# Patient Record
Sex: Female | Born: 2005 | Race: White | Hispanic: No | Marital: Single | Smoking: Never smoker
Health system: Southern US, Community
[De-identification: ages and names within clinical notes are randomized; demographics above are authoritative.]

## PROBLEM LIST (undated history)

## (undated) DIAGNOSIS — L709 Acne, unspecified: Secondary | ICD-10-CM

## (undated) DIAGNOSIS — L309 Dermatitis, unspecified: Secondary | ICD-10-CM

## (undated) HISTORY — DX: Acne, unspecified: L70.9

## (undated) HISTORY — DX: Dermatitis, unspecified: L30.9

---
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
## 2024-01-07 ENCOUNTER — Ambulatory Visit: Payer: Self-pay | Admitting: Family Medicine

## 2024-01-07 NOTE — Telephone Encounter (Signed)
 Chief Complaint: Wrong medication given Symptoms: Lethargic, dizzy Frequency: N/A, symptoms gone Pertinent Negatives: Patient denies any symptoms currently Disposition: [] ED /[] Urgent Care (no appt availability in office) / [] Appointment(In office/virtual)/ []  [facility]/ [] Home Care/ [] Refused Recommended Disposition /[] [facility]/ [x]  Follow-up with PCP Additional Notes: Spoke with pt's mom, Victorino Dike. Pt was prescribed glycopyrrolate for excessive sweating. Pt mom realized last night the pill looked different than normal and she realized it said watson 461 (glipizide). Pt was prescribed the wrong medication at [facility]. The glycopyrrolate sometimes causes lethargy and dizziness, which pt experienced with watson 461. Pt noticed the side effects were worse than normal so pt stopped taking medication on Thursday. Pt has not experienced any side effects since stopping the medication. Pt mom wanted to make provider aware of what happened. Pt mom states she is [facility] and [facility] right [facility]. This RN educated pt mom on when to call back/seek emergent care. Pt mo verbalized understanding and agrees to plan.   Copied from CRM [id number]. Topic: Clinical - Red Word Triage >> Jan 07, 2024 10:46 AM Theodis Sato wrote: Red Word that prompted transfer to Nurse Triage: Patients mother calling in as the patients pharmacy gave the patient the wrong medication - a type 2 diabetes medication. Patient was prescribed glycopyrrolate (ROBINUL) 2 MG tablet - Patients mother states her daughter has been feeling "weird" and has taken 4 of the incorrect pills. Reason for Disposition  Caller has medicine question only, adult not sick, AND triager answers question  Answer Assessment - Initial Assessment Questions 1. NAME of MEDICINE: "What medicine(s) are you calling about?"     Glycopyrrolate 2 mg daily; pt mom noticed the pill said watson 461 (glipizide) 2. PRESCRIBER:  "Who prescribed the medicine?" Reason: if prescribed by specialist, call should be referred to that group.     Excell Seltzer, MD 3. SYMPTOMS: "Do you have any symptoms?" If Yes, ask: "What symptoms are you having?"  "How bad are the symptoms (e.g., mild, moderate, severe)     No symptoms currently- was having lethargy and dizziness  Protocols used: Medication Question Call-A-AH

## 2024-01-08 NOTE — Telephone Encounter (Signed)
 Noted, I checked her records and the correct prescription was ordered from our end.

## 2024-01-23 ENCOUNTER — Telehealth: Payer: Self-pay | Admitting: Family Medicine

## 2024-01-23 NOTE — Telephone Encounter (Signed)
 Lvmtcb, sent mychart message

## 2024-01-23 NOTE — Telephone Encounter (Signed)
 Please call this patient and ask her to make an appointment around April 20 for a lab visit followed by an in person office visit with me. Her mom has requested this but does not want the patient to know that she is involved.

## 2024-01-25 NOTE — Telephone Encounter (Signed)
 Maria Figueroa notified as instructed by telephone. Fasting lab appointment scheduled for 03/03/2024 at 10:30 am and Dr. Ermalene Searing 03/07/2024 at 10:40 am.

## 2024-01-25 NOTE — Telephone Encounter (Signed)
 Copied from CRM [id number]. Topic: Clinical - Request for Lab/Test Order >> Jan 25, 2024 10:48 AM Sim Boast F wrote: Reason for CRM: Patient called and would like to know why she needs to come in for bloodwork since she had bloodwork in January and a follow up scheduled for July?

## 2024-01-25 NOTE — Telephone Encounter (Signed)
 Let the patient know I would like to do a 5-month lab check so sometime around April 20 given her blood sugar A1c test had increased intervally over the last 2 tests.

## 2024-02-18 ENCOUNTER — Telehealth: Payer: Self-pay | Admitting: *Deleted

## 2024-02-18 DIAGNOSIS — R7303 Prediabetes: Secondary | ICD-10-CM

## 2024-02-18 DIAGNOSIS — E781 Pure hyperglyceridemia: Secondary | ICD-10-CM

## 2024-02-18 NOTE — Telephone Encounter (Signed)
-----   Message from Alvina Chou sent at 02/18/2024 11:36 AM EDT ----- Regarding: Lab orders for Mon, 4.21.25 Lab orders, thanks

## 2024-03-03 ENCOUNTER — Other Ambulatory Visit (INDEPENDENT_AMBULATORY_CARE_PROVIDER_SITE_OTHER)

## 2024-03-03 DIAGNOSIS — E781 Pure hyperglyceridemia: Secondary | ICD-10-CM | POA: Diagnosis not present

## 2024-03-03 DIAGNOSIS — R7303 Prediabetes: Secondary | ICD-10-CM | POA: Diagnosis not present

## 2024-03-03 LAB — COMPREHENSIVE METABOLIC PANEL WITH GFR
ALT: 18 U/L (ref 0–35)
AST: 18 U/L (ref 0–37)
Albumin: 4.3 g/dL (ref 3.5–5.2)
Alkaline Phosphatase: 67 U/L (ref 47–119)
BUN: 13 mg/dL (ref 6–23)
CO2: 25 meq/L (ref 19–32)
Calcium: 9.1 mg/dL (ref 8.4–10.5)
Chloride: 104 meq/L (ref 96–112)
Creatinine, Ser: 0.57 mg/dL (ref 0.40–1.20)
GFR: 132.69 mL/min (ref >60.00)
Glucose, Bld: 91 mg/dL (ref 70–99)
Potassium: 3.8 meq/L (ref 3.5–5.1)
Sodium: 138 meq/L (ref 135–145)
Total Bilirubin: 0.2 mg/dL — ABNORMAL LOW (ref 0.3–1.2)
Total Protein: 7.5 g/dL (ref 6.0–8.3)

## 2024-03-03 LAB — LIPID PANEL
Cholesterol: 131 mg/dL (ref 0–200)
HDL: 33.9 mg/dL — ABNORMAL LOW (ref >39.00)
LDL Cholesterol: 47 mg/dL (ref 0–99)
NonHDL: 96.76
Total CHOL/HDL Ratio: 4
Triglycerides: 247 mg/dL — ABNORMAL HIGH (ref 0.0–149.0)
VLDL: 49.4 mg/dL — ABNORMAL HIGH (ref 0.0–40.0)

## 2024-03-03 LAB — HEMOGLOBIN A1C: Hgb A1c MFr Bld: 5.7 % (ref 4.6–6.5)

## 2024-03-04 ENCOUNTER — Encounter: Payer: Self-pay | Admitting: Family Medicine

## 2024-03-04 NOTE — Progress Notes (Signed)
 No critical labs need to be addressed urgently. We will discuss labs in detail at upcoming office visit.

## 2024-03-07 ENCOUNTER — Encounter: Payer: Self-pay | Admitting: Family Medicine

## 2024-03-07 ENCOUNTER — Ambulatory Visit: Admitting: Family Medicine

## 2024-03-07 VITALS — BP 100/70 | HR 84 | Temp 99.2°F | Ht 63.25 in | Wt 218.2 lb

## 2024-03-07 DIAGNOSIS — Z6836 Body mass index (BMI) 36.0-36.9, adult: Secondary | ICD-10-CM | POA: Diagnosis not present

## 2024-03-07 DIAGNOSIS — E661 Drug-induced obesity: Secondary | ICD-10-CM | POA: Diagnosis not present

## 2024-03-07 DIAGNOSIS — R7303 Prediabetes: Secondary | ICD-10-CM | POA: Diagnosis not present

## 2024-03-07 DIAGNOSIS — E66812 Obesity, class 2: Secondary | ICD-10-CM | POA: Diagnosis not present

## 2024-03-07 DIAGNOSIS — Z23 Encounter for immunization: Secondary | ICD-10-CM | POA: Diagnosis not present

## 2024-03-07 DIAGNOSIS — Z3009 Encounter for other general counseling and advice on contraception: Secondary | ICD-10-CM

## 2024-03-07 MED ORDER — DROSPIRENONE-ETHINYL ESTRADIOL 3-0.03 MG PO TABS: 1.0000 | ORAL_TABLET | Freq: Every day | ORAL | 11 refills | Status: DC

## 2024-03-07 NOTE — Assessment & Plan Note (Addendum)
 Reviewed options for birth control in detail with patient.  We discussed possible side effects and use of different birth control options.  She states she is very good at remembering to take things regularly so we have decided to proceed with oral contraceptive pills.  Hopefully this will help regulate her menstrual cycle better and also provide her with birth control protection if she becomes sexually active.  We did review that this does not protect her from STDs.  I have selected Yasmin given its low androgenic side effects and likely benefit with acne, possible PCOS.

## 2024-03-07 NOTE — Assessment & Plan Note (Signed)
 Chronic, strong family history of obesity related complications such as coronary artery disease.  We discussed this in detail as well as continued lifestyle changes.  She has decreased her physical activity some but will try to increase this back up.  We reviewed her diet in detail and discussed healthy options. We also discussed her going to college in the fall and setting up a healthy plan to avoid weight gain once she is on her own at college. We reviewed her growth chart in detail and I showed her how at her age she is greater than the 99th percentile for her weight. She voiced complete understanding of the need for lifestyle changes.  We will reevaluate in 1 year.

## 2024-03-07 NOTE — Progress Notes (Signed)
 Patient ID: Maria Figueroa, female    DOB: July 03, 2006, 18 y.o.   MRN: [id number]  This visit was conducted in person.  BP 100/70 (BP Location: Left Arm, Patient Position: Sitting, Cuff Size: Large)   Pulse 84   Temp 99.2 F (37.3 C) (Temporal)   Ht 5' 3.25" (1.607 m)   Wt 218 lb 4 oz (99 kg)   LMP 02/23/2024 (Approximate)   SpO2 96%   BMI 38.36 kg/m    CC:  Chief Complaint  Patient presents with   Results    Follow up lab results   Contraception    Discuss getting on Birth control     Subjective:   HPI: Maria Figueroa is a 18 y.o. female with autism spectrum disorder and obesity presenting on 03/07/2024 for Results (Follow up lab results) and Contraception (Discuss getting on Birth control/)  She has a strong family history of diabetes,  HTN, heart disease and high cholesterol. Recent labs performed given concern for poor eating habits and mental activity.  Labs showed slight improvement in A1c but continued elevation of triglycerides.  She has low HDL.  LDL is at goal at 47. She is trying to walk some.Aaron Aas less than before given busy.  SHe has less appetite.Aaron Aas eating less meals.Aaron Aas occ skipping meals. Mom makes healthy meals for dinner.  Not eating out much.. possibly 2-3 days a week. Some coffee srinks.  Obesity Body mass index is 38.36 kg/m. Wt Readings from Last 3 Encounters:  03/07/24 218 lb 4 oz (99 kg) (99%, Z= 2.18)*  11/20/23 213 lb 8 oz (96.8 kg) (98%, Z= 2.14)*  06/02/23 (!) 209 lb 7 oz (95 kg) (98%, Z= 2.10)*   * Growth percentiles are based on CDC (Girls, 2-20 Years) data.    She is planning on starting college in fall. She talked with Mom about OCP... thought might be important  to consider.  Not sexually active. She reports she is interested in getting birth control  Irregular menses, heavy first 2-3 days, last 4-5 days, come cramping      Relevant past medical, surgical, family and social history reviewed and updated as indicated. Interim medical  history since our last visit reviewed. Allergies and medications reviewed and updated. Outpatient Medications Prior to Visit  Medication Sig Dispense Refill   glycopyrrolate  (ROBINUL ) 2 MG tablet Take 1 tablet (2 mg total) by mouth daily. 30 tablet 2   Pediatric Multivit-Minerals-C (MULTIVITAMIN GUMMIES CHILDRENS PO) Take 2 each by mouth daily.     FLUoxetine (PROZAC) 20 MG capsule Take 20 mg by mouth every morning.     FLUoxetine (PROZAC) 10 MG capsule Take 10 mg by mouth every morning.     FLUoxetine (PROZAC) 40 MG capsule Take 40 mg by mouth daily.     No facility-administered medications prior to visit.     Per HPI unless specifically indicated in ROS section below Review of Systems  Constitutional:  Negative for fatigue and fever.  HENT:  Negative for congestion.   Eyes:  Negative for pain.  Respiratory:  Negative for cough and shortness of breath.   Cardiovascular:  Negative for chest pain, palpitations and leg swelling.  Gastrointestinal:  Negative for abdominal pain.  Genitourinary:  Negative for dysuria and vaginal bleeding.  Musculoskeletal:  Negative for back pain.  Neurological:  Negative for syncope, light-headedness and headaches.  Psychiatric/Behavioral:  Negative for dysphoric mood.    Objective:  BP 100/70 (BP Location: Left Arm, Patient Position: Sitting, Cuff Size: Large)  Pulse 84   Temp 99.2 F (37.3 C) (Temporal)   Ht 5' 3.25" (1.607 m)   Wt 218 lb 4 oz (99 kg)   LMP 02/23/2024 (Approximate)   SpO2 96%   BMI 38.36 kg/m   Wt Readings from Last 3 Encounters:  03/07/24 218 lb 4 oz (99 kg) (99%, Z= 2.18)*  11/20/23 213 lb 8 oz (96.8 kg) (98%, Z= 2.14)*  06/02/23 (!) 209 lb 7 oz (95 kg) (98%, Z= 2.10)*   * Growth percentiles are based on CDC (Girls, 2-20 Years) data.      Physical Exam Constitutional:      General: She is not in acute distress.    Appearance: Normal appearance. She is well-developed. She is obese. She is not ill-appearing or  toxic-appearing.  HENT:     Head: Normocephalic.     Right Ear: Hearing, tympanic membrane, ear canal and external ear normal. Tympanic membrane is not erythematous, retracted or bulging.     Left Ear: Hearing, tympanic membrane, ear canal and external ear normal. Tympanic membrane is not erythematous, retracted or bulging.     Nose: No mucosal edema or rhinorrhea.     Right Sinus: No maxillary sinus tenderness or frontal sinus tenderness.     Left Sinus: No maxillary sinus tenderness or frontal sinus tenderness.     Mouth/Throat:     Pharynx: Uvula midline.  Eyes:     General: Lids are normal. Lids are everted, no foreign bodies appreciated.     Conjunctiva/sclera: Conjunctivae normal.     Pupils: Pupils are equal, round, and reactive to light.  Neck:     Thyroid : No thyroid  mass or thyromegaly.     Vascular: No carotid bruit.     Trachea: Trachea normal.  Cardiovascular:     Rate and Rhythm: Normal rate and regular rhythm.     Pulses: Normal pulses.     Heart sounds: Normal heart sounds, S1 normal and S2 normal. No murmur heard.    No friction rub. No gallop.  Pulmonary:     Effort: Pulmonary effort is normal. No tachypnea or respiratory distress.     Breath sounds: Normal breath sounds. No decreased breath sounds, wheezing, rhonchi or rales.  Abdominal:     General: Bowel sounds are normal.     Palpations: Abdomen is soft.     Tenderness: There is no abdominal tenderness.  Musculoskeletal:     Cervical back: Normal range of motion and neck supple.  Skin:    General: Skin is warm and dry.     Findings: No rash.  Neurological:     Mental Status: She is alert.  Psychiatric:        Mood and Affect: Mood is not anxious or depressed.        Speech: Speech normal.        Behavior: Behavior normal. Behavior is cooperative.        Thought Content: Thought content normal.        Judgment: Judgment normal.       Results for orders placed or performed in visit on 03/03/24   Comprehensive metabolic panel   Collection Time: 03/03/24 10:24 AM  Result Value Ref Range   Sodium 138 135 - 145 mEq/L   Potassium 3.8 3.5 - 5.1 mEq/L   Chloride 104 96 - 112 mEq/L   CO2 25 19 - 32 mEq/L   Glucose, Bld 91 70 - 99 mg/dL   BUN 13 6 - 23 mg/dL  Creatinine, Ser 0.57 0.40 - 1.20 mg/dL   Total Bilirubin 0.2 (L) 0.3 - 1.2 mg/dL   Alkaline Phosphatase 67 47 - 119 U/L   AST 18 0 - 37 U/L   ALT 18 0 - 35 U/L   Total Protein 7.5 6.0 - 8.3 g/dL   Albumin 4.3 3.5 - 5.2 g/dL   GFR 161.09 >60.45 mL/min   Calcium 9.1 8.4 - 10.5 mg/dL  Lipid panel   Collection Time: 03/03/24 10:24 AM  Result Value Ref Range   Cholesterol 131 0 - 200 mg/dL   Triglycerides 409.8 (H) 0.0 - 149.0 mg/dL   HDL 11.91 (L) >47.82 mg/dL   VLDL 95.6 (H) 0.0 - 21.3 mg/dL   LDL Cholesterol 47 0 - 99 mg/dL   Total CHOL/HDL Ratio 4    NonHDL 96.76   Hemoglobin A1c   Collection Time: 03/03/24 10:24 AM  Result Value Ref Range   Hgb A1c MFr Bld 5.7 4.6 - 6.5 %    Assessment and Plan  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  We discussed this in detail as well as continued lifestyle changes.  She has decreased her physical activity some but will try to increase this back up.  We reviewed her diet in detail and discussed healthy options. We also discussed her going to college in the fall and setting up a healthy plan to avoid weight gain once she is on her own at college. We reviewed her growth chart in detail and I showed her how at her age she is greater than the 99th percentile for her weight. She voiced complete understanding of the need for lifestyle changes.  We will reevaluate in 1 year.   Birth control counseling Assessment & Plan: Reviewed options for birth control in detail with patient.  We discussed possible  side effects and use of different birth control options.  She states she is very good at remembering to take things regularly so we have decided to proceed with oral contraceptive pills.  Hopefully this will help regulate her menstrual cycle better and also provide her with birth control protection if she becomes sexually active.  We did review that this does not protect her from STDs.  I have selected Yasmin given its low androgenic side effects and likely benefit with acne, possible PCOS.   Need for HPV vaccination -     HPV 9-valent vaccine,Recombinat  Prediabetes Assessment & Plan: Chronic, some improvement with lifestyle changes.  Discussed possible progression of prediabetes to diabetes if no lifestyle changes are made.   Other orders -     Drospirenone-Ethinyl Estradiol; Take 1 tablet by mouth daily.  Dispense: 28 tablet; Refill: 11    No follow-ups on file.   Herby Lolling, MD

## 2024-03-07 NOTE — Assessment & Plan Note (Signed)
 Chronic, some improvement with lifestyle changes.  Discussed possible progression of prediabetes to diabetes if no lifestyle changes are made.

## 2024-04-11 ENCOUNTER — Other Ambulatory Visit: Payer: Self-pay | Admitting: Family Medicine

## 2024-04-11 DIAGNOSIS — L74519 Primary focal hyperhidrosis, unspecified: Secondary | ICD-10-CM

## 2024-05-20 ENCOUNTER — Ambulatory Visit: Payer: 59 | Admitting: Family Medicine

## 2024-05-20 ENCOUNTER — Encounter: Payer: Self-pay | Admitting: Family Medicine

## 2024-05-20 ENCOUNTER — Ambulatory Visit: Admitting: Family Medicine

## 2024-05-20 VITALS — BP 100/60 | HR 57 | Temp 98.5°F | Ht 63.5 in | Wt 200.0 lb

## 2024-05-20 DIAGNOSIS — Z6834 Body mass index (BMI) 34.0-34.9, adult: Secondary | ICD-10-CM

## 2024-05-20 DIAGNOSIS — E66811 Obesity, class 1: Secondary | ICD-10-CM

## 2024-05-20 DIAGNOSIS — E661 Drug-induced obesity: Secondary | ICD-10-CM

## 2024-05-20 NOTE — Assessment & Plan Note (Addendum)
 Chronic, strong family history of obesity related complications such as coronary artery disease.   She will continue to w=ork on heatlhy eating and active lifestyle.  We also discussed her going to college in the fall and setting up a healthy plan to avoid weight gain once she is on her own at college. She voiced complete understanding of the need for lifestyle changes.  We will reevaluate in at her [facility] in 11/2023

## 2024-05-20 NOTE — Progress Notes (Signed)
 Patient ID: Maria Figueroa, female    DOB: Jan 14, 2006, 18 y.o.   MRN: [id number]  This visit was conducted in person.  BP 100/60   Pulse (!) 57   Temp 98.5 F (36.9 C) (Oral)   Ht 5' 3.5 (1.613 m)   Wt 200 lb (90.7 kg)   SpO2 98%   BMI 34.87 kg/m    CC:  Chief Complaint  Patient presents with   Weight Management Screening    Subjective:   HPI: Maria Figueroa is a 18 y.o. female presenting on 05/20/2024 for Weight Management Screening   She returns to follow up on weight management. She has been continuing to work on healthy eating. She states she has decreased appetite so does not eat as much as previously.  She takes a multivitamin.  She has lost 18 lbs in last 3 months.. walking frequently.  She is no longer using OCPs... she felt acne got worse. She is not interested in trying a different OCP at this time. She is not sexually acitive. Discussed STI and preg prevention.  Wt Readings from Last 3 Encounters:  05/20/24 200 lb (90.7 kg) (98%, Z= 1.96)*  03/07/24 218 lb 4 oz (99 kg) (99%, Z= 2.18)*  11/20/23 213 lb 8 oz (96.8 kg) (98%, Z= 2.14)*   * Growth percentiles are based on CDC (Girls, 2-20 Years) data.    Lab Results  Component Value Date   HGBA1C 5.7 03/03/2024        Relevant past medical, surgical, family and social history reviewed and updated as indicated. Interim medical history since our last visit reviewed. Allergies and medications reviewed and updated. Outpatient Medications Prior to Visit  Medication Sig Dispense Refill   glycopyrrolate  (ROBINUL ) 2 MG tablet TAKE 1 TABLET BY MOUTH EVERY DAY 30 tablet 2   Pediatric Multivit-Minerals-C (MULTIVITAMIN GUMMIES CHILDRENS PO) Take 2 each by mouth daily.     drospirenone -ethinyl estradiol  (YASMIN ) 3-0.03 MG tablet Take 1 tablet by mouth daily. 28 tablet 11   No facility-administered medications prior to visit.     Per HPI unless specifically indicated in ROS section below Review of Systems   Constitutional:  Negative for fatigue and fever.  HENT:  Negative for congestion.   Eyes:  Negative for pain.  Respiratory:  Negative for cough and shortness of breath.   Cardiovascular:  Negative for chest pain, palpitations and leg swelling.  Gastrointestinal:  Negative for abdominal pain.  Genitourinary:  Negative for dysuria and vaginal bleeding.  Musculoskeletal:  Negative for back pain.  Neurological:  Negative for syncope, light-headedness and headaches.  Psychiatric/Behavioral:  Negative for dysphoric mood.    Objective:  BP 100/60   Pulse (!) 57   Temp 98.5 F (36.9 C) (Oral)   Ht 5' 3.5 (1.613 m)   Wt 200 lb (90.7 kg)   SpO2 98%   BMI 34.87 kg/m   Wt Readings from Last 3 Encounters:  05/20/24 200 lb (90.7 kg) (98%, Z= 1.96)*  03/07/24 218 lb 4 oz (99 kg) (99%, Z= 2.18)*  11/20/23 213 lb 8 oz (96.8 kg) (98%, Z= 2.14)*   * Growth percentiles are based on CDC (Girls, 2-20 Years) data.      Physical Exam Constitutional:      General: She is not in acute distress.    Appearance: Normal appearance. She is well-developed. She is not ill-appearing or toxic-appearing.  HENT:     Head: Normocephalic.     Right Ear: Hearing, tympanic membrane, ear canal  and external ear normal. Tympanic membrane is not erythematous, retracted or bulging.     Left Ear: Hearing, tympanic membrane, ear canal and external ear normal. Tympanic membrane is not erythematous, retracted or bulging.     Nose: No mucosal edema or rhinorrhea.     Right Sinus: No maxillary sinus tenderness or frontal sinus tenderness.     Left Sinus: No maxillary sinus tenderness or frontal sinus tenderness.     Mouth/Throat:     Pharynx: Uvula midline.  Eyes:     General: Lids are normal. Lids are everted, no foreign bodies appreciated.     Conjunctiva/sclera: Conjunctivae normal.     Pupils: Pupils are equal, round, and reactive to light.  Neck:     Thyroid : No thyroid  mass or thyromegaly.     Vascular: No  carotid bruit.     Trachea: Trachea normal.  Cardiovascular:     Rate and Rhythm: Normal rate and regular rhythm.     Pulses: Normal pulses.     Heart sounds: Normal heart sounds, S1 normal and S2 normal. No murmur heard.    No friction rub. No gallop.  Pulmonary:     Effort: Pulmonary effort is normal. No tachypnea or respiratory distress.     Breath sounds: Normal breath sounds. No decreased breath sounds, wheezing, rhonchi or rales.  Abdominal:     General: Bowel sounds are normal.     Palpations: Abdomen is soft.     Tenderness: There is no abdominal tenderness.  Musculoskeletal:     Cervical back: Normal range of motion and neck supple.  Skin:    General: Skin is warm and dry.     Findings: No rash.  Neurological:     Mental Status: She is alert.  Psychiatric:        Mood and Affect: Mood is not anxious or depressed.        Speech: Speech normal.        Behavior: Behavior normal. Behavior is cooperative.        Thought Content: Thought content normal.        Judgment: Judgment normal.       Results for orders placed or performed in visit on 03/03/24  Comprehensive metabolic panel   Collection Time: 03/03/24 10:24 AM  Result Value Ref Range   Sodium 138 135 - 145 mEq/L   Potassium 3.8 3.5 - 5.1 mEq/L   Chloride 104 96 - 112 mEq/L   CO2 25 19 - 32 mEq/L   Glucose, Bld 91 70 - 99 mg/dL   BUN 13 6 - 23 mg/dL   Creatinine, Ser 9.42 0.40 - 1.20 mg/dL   Total Bilirubin 0.2 (L) 0.3 - 1.2 mg/dL   Alkaline Phosphatase 67 47 - 119 U/L   AST 18 0 - 37 U/L   ALT 18 0 - 35 U/L   Total Protein 7.5 6.0 - 8.3 g/dL   Albumin 4.3 3.5 - 5.2 g/dL   GFR 867.30 >39.99 mL/min   Calcium 9.1 8.4 - 10.5 mg/dL  Lipid panel   Collection Time: 03/03/24 10:24 AM  Result Value Ref Range   Cholesterol 131 0 - 200 mg/dL   Triglycerides 752.9 (H) 0.0 - 149.0 mg/dL   HDL 66.09 (L) >60.99 mg/dL   VLDL 50.5 (H) 0.0 - 59.9 mg/dL   LDL Cholesterol 47 0 - 99 mg/dL   Total CHOL/HDL Ratio 4     NonHDL 96.76   Hemoglobin A1c   Collection Time: 03/03/24 10:24  AM  Result Value Ref Range   Hgb A1c MFr Bld 5.7 4.6 - 6.5 %    Assessment and Plan  Class 1 drug-induced obesity without serious comorbidity with body mass index (BMI) of 34.0 to 34.9 in adult Assessment & Plan: Chronic, strong family history of obesity related complications such as coronary artery disease.   She will continue to w=ork on heatlhy eating and active lifestyle.  We also discussed her going to college in the fall and setting up a healthy plan to avoid weight gain once she is on her own at college. She voiced complete understanding of the need for lifestyle changes.  We will reevaluate in at her [facility] in 11/2023     No follow-ups on file.   Greig Ring, MD

## 2024-06-02 DIAGNOSIS — F840 Autistic disorder: Secondary | ICD-10-CM | POA: Diagnosis not present

## 2024-06-02 DIAGNOSIS — F321 Major depressive disorder, single episode, moderate: Secondary | ICD-10-CM | POA: Diagnosis not present

## 2024-06-02 DIAGNOSIS — F419 Anxiety disorder, unspecified: Secondary | ICD-10-CM | POA: Diagnosis not present

## 2024-07-09 ENCOUNTER — Other Ambulatory Visit: Payer: Self-pay | Admitting: Family Medicine

## 2024-07-09 DIAGNOSIS — L74519 Primary focal hyperhidrosis, unspecified: Secondary | ICD-10-CM

## 2024-10-27 ENCOUNTER — Other Ambulatory Visit: Payer: Self-pay | Admitting: Family Medicine

## 2024-10-27 DIAGNOSIS — L74519 Primary focal hyperhidrosis, unspecified: Secondary | ICD-10-CM

## 2024-10-27 NOTE — Telephone Encounter (Unsigned)
 Copied from CRM #[id number]. Topic: Clinical - Medication Refill >> Oct 27, 2024 10:54 AM Mesmerise C wrote: Medication: glycopyrrolate  (ROBINUL ) 2 MG tablet  Has the patient contacted their pharmacy? Yes (Agent: If no, request that the patient contact the pharmacy for the refill. If patient does not wish to contact the pharmacy document the reason why and proceed with request.) (Agent: If yes, when and what did the pharmacy advise?) Out of refills This is the patient's preferred pharmacy:   [phone], [place] - [address] [place] [address] Phone: [phone] Fax: [phone]  Is this the correct pharmacy for this prescription? Yes If no, delete pharmacy and type the correct one.   Has the prescription been filled recently? No  Is the patient out of the medication? No  Has the patient been seen for an appointment in the last year OR does the patient have an upcoming appointment? Yes  Can we respond through MyChart? Yes  Agent: Please be advised that Rx refills may take up to 3 business days. We ask that you follow-up with your pharmacy.

## 2024-10-28 MED ORDER — GLYCOPYRROLATE 2 MG PO TABS: 2.0000 mg | ORAL_TABLET | Freq: Every day | ORAL | 2 refills | Status: AC

## 2024-10-30 DIAGNOSIS — F411 Generalized anxiety disorder: Secondary | ICD-10-CM | POA: Diagnosis not present

## 2024-10-30 DIAGNOSIS — F321 Major depressive disorder, single episode, moderate: Secondary | ICD-10-CM | POA: Diagnosis not present
